# Patient Record
Sex: Male | Born: 1990 | Race: White | Hispanic: No | Marital: Married | Smoking: Never smoker
Health system: Southern US, Community
[De-identification: ages and names within clinical notes are randomized; demographics above are authoritative.]

---
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
## 2017-08-20 ENCOUNTER — Encounter: Payer: Self-pay | Admitting: Emergency Medicine

## 2017-08-20 ENCOUNTER — Other Ambulatory Visit: Payer: Self-pay

## 2017-08-20 ENCOUNTER — Emergency Department: Payer: BLUE CROSS/BLUE SHIELD

## 2017-08-20 DIAGNOSIS — R079 Chest pain, unspecified: Secondary | ICD-10-CM | POA: Diagnosis present

## 2017-08-20 LAB — CBC
HCT: 41.9 % (ref 40.0–52.0)
Hemoglobin: 14.5 g/dL (ref 13.0–18.0)
MCH: 31 pg (ref 26.0–34.0)
MCHC: 34.7 g/dL (ref 32.0–36.0)
MCV: 89.5 fL (ref 80.0–100.0)
Platelets: 288 10*3/uL (ref 150–440)
RBC: 4.68 MIL/uL (ref 4.40–5.90)
RDW: 12.6 % (ref 11.5–14.5)
WBC: 10.5 10*3/uL (ref 3.8–10.6)

## 2017-08-20 LAB — TROPONIN I

## 2017-08-20 LAB — BASIC METABOLIC PANEL
ANION GAP: 11 (ref 5–15)
BUN: 14 mg/dL (ref 6–20)
CALCIUM: 9.2 mg/dL (ref 8.9–10.3)
CO2: 27 mmol/L (ref 22–32)
CREATININE: 1.33 mg/dL — AB (ref 0.61–1.24)
Chloride: 101 mmol/L (ref 101–111)
GFR calc Af Amer: >60 mL/min (ref >60)
GLUCOSE: 99 mg/dL (ref 65–99)
Potassium: 3.9 mmol/L (ref 3.5–5.1)
Sodium: 139 mmol/L (ref 135–145)

## 2017-08-20 NOTE — ED Triage Notes (Signed)
Pt arrives ambulatory to triage with c/o left sided chest pain all day. Pt denies any other cardiac symptoms at this time. Pt is in NAD.

## 2017-08-21 ENCOUNTER — Emergency Department
Admission: EM | Admit: 2017-08-21 | Discharge: 2017-08-21 | Disposition: A | Discharge status: Home / Self Care | Payer: BLUE CROSS/BLUE SHIELD | Attending: Emergency Medicine | Admitting: Emergency Medicine

## 2017-08-21 DIAGNOSIS — R079 Chest pain, unspecified: Secondary | ICD-10-CM

## 2017-08-21 LAB — TROPONIN I

## 2017-08-21 MED ORDER — ASPIRIN 81 MG PO CHEW
324.0000 mg | CHEWABLE_TABLET | Freq: Once | ORAL | Status: AC
  Administered 2017-08-21: 324 mg via ORAL
  Filled 2017-08-21: qty 4

## 2017-08-21 MED ORDER — GI COCKTAIL ~~LOC~~
30.0000 mL | Freq: Once | ORAL | Status: AC
  Administered 2017-08-21: 30 mL via ORAL
  Filled 2017-08-21: qty 30

## 2017-08-21 MED ORDER — FAMOTIDINE 40 MG PO TABS: 40.0000 mg | ORAL_TABLET | Freq: Every evening | ORAL | 0 refills | Status: AC

## 2017-08-21 NOTE — ED Provider Notes (Signed)
[facility] Emergency Department Provider Note   ____________________________________________   First MD Initiated Contact with Patient 08/21/17 [phone]     (approximate)  I have reviewed the triage vital signs and the nursing notes.   HISTORY  Chief Complaint Chest Pain    HPI Chad Figueroa is a 27 y.o. male who comes into the hospital today with some mid chest pain that started this morning.  The patient reports that it feels like some pressure but he denies any radiation.  He also denies nausea vomiting dizziness or lightheadedness.  The patient states that sometimes it feels as though is hard to breathe but he rates his pain a 5-6 out of 10 in intensity.  The patient states that he took some Advil for the pain and it helped a little bit but it did not go away completely.  The patient has had some occasional coughing but nothing severe.  He denies any heavy lifting or spicy foods.  He is here today for evaluation of his pain.  History reviewed. No pertinent past medical history.  There are no active problems to display for this patient.   History reviewed. No pertinent surgical history.  Prior to Admission medications   Medication Sig Start Date End Date Taking? Authorizing Provider  famotidine (PEPCID) 40 MG tablet Take 1 tablet (40 mg total) by mouth every evening. 08/21/17 08/21/18  Rebecka Apley, MD    Allergies Penicillins  No family history on file.  Social History Social History   Tobacco Use  . Smoking status: Never Smoker  . Smokeless tobacco: Never Used  Substance Use Topics  . Alcohol use: Yes    Frequency: Never    Comment: rarely  . Drug use: No    Review of Systems  Constitutional: No fever/chills Eyes: No visual changes. ENT: No sore throat. Cardiovascular:  chest pain. Respiratory:  shortness of breath. Gastrointestinal: No abdominal pain.  No nausea, no vomiting.  No diarrhea.  No  constipation. Genitourinary: Negative for dysuria. Musculoskeletal: Negative for back pain. Skin: Negative for rash. Neurological: Negative for headaches, focal weakness or numbness.   ____________________________________________   PHYSICAL EXAM:  VITAL SIGNS: ED Triage Vitals  Enc Vitals Group     BP 08/20/17 2223 124/67     Pulse Rate 08/20/17 2223 (!) 110     Resp 08/20/17 2223 20     Temp 08/20/17 2223 98.5 F (36.9 C)     Temp Source 08/20/17 2223 Oral     SpO2 08/20/17 2223 98 %     Weight 08/20/17 2220 221 lb (100.2 kg)     Height 08/20/17 2220 5\' 9"  (1.753 m)     Head Circumference --      Peak Flow --      Pain Score 08/20/17 2218 6     Pain Loc --      Pain Edu? --      Excl. in GC? --     Constitutional: Alert and oriented. Well appearing and in mild distress. Eyes: Conjunctivae are normal. PERRL. EOMI. Head: Atraumatic. Nose: No congestion/rhinnorhea. Mouth/Throat: Mucous membranes are moist.  Oropharynx non-erythematous. Cardiovascular: Normal rate, regular rhythm. Grossly normal heart sounds.  Good peripheral circulation. Respiratory: Normal respiratory effort.  No retractions. Lungs CTAB. Gastrointestinal: Soft and nontender. No distention.  Positive bowel sounds Musculoskeletal: No lower extremity tenderness nor edema.   Neurologic:  Normal speech and language.  Skin:  Skin is warm, dry and intact.  Psychiatric:  Mood and affect are normal.   ____________________________________________   LABS (all labs ordered are listed, but only abnormal results are displayed)  Labs Reviewed  BASIC METABOLIC PANEL - Abnormal; Notable for the following components:      Result Value   Creatinine, Ser 1.33 (*)    All other components within normal limits  CBC  TROPONIN I  TROPONIN I   ____________________________________________  EKG  ED ECG REPORT I, Rebecka ApleyWebster,  Isa Hitz P, the attending physician, personally viewed and interpreted this ECG.   Date:  08/20/2017  EKG Time: 2223  Rate: 111  Rhythm: sinus tachycardia  Axis: normal  Intervals:none  ST&T Change: T waves in lead Figueroa and some T wave flattening in lead aVF  ____________________________________________  RADIOLOGY  Dg Chest 2 View  Result Date: 08/20/2017 CLINICAL DATA:  27 year old male with chest pain. EXAM: CHEST  2 VIEW COMPARISON:  None. FINDINGS: The heart size and mediastinal contours are within normal limits. Both lungs are clear. The visualized skeletal structures are unremarkable. IMPRESSION: No active cardiopulmonary disease. Electronically Signed   By: Elgie CollardArash  Radparvar M.D.   On: 08/20/2017 22:53    ____________________________________________   PROCEDURES  Procedure(s) performed: None  Procedures  Critical Care performed: No  ____________________________________________   INITIAL IMPRESSION / ASSESSMENT AND PLAN / ED COURSE  As part of my medical decision making, I reviewed the following data within the electronic MEDICAL RECORD NUMBER Notes from prior ED visits and [place] Controlled Substance Database   This is a 27 year old male who comes into the hospital today with some chest pain.  It is been going on all day.  My differential diagnosis includes acute coronary syndrome, reflux, pulmonary pathology   The patient had some blood work drawn and it was unremarkable.  I also drew 2 troponins which were negative.  Patient had a chest x-ray which was negative.  I gave the patient a GI cocktail as well as some aspirin for his pain and his pain improved to a 1 out of 10.  I feel that the patient may have some reflux is causing some of his pain.  I still feel that he needs further evaluation by his primary care physician of his heart.  The patient will be discharged home to follow-up with his primary care physician.       ____________________________________________   FINAL CLINICAL IMPRESSION(S) / ED DIAGNOSES  Final diagnoses:  Chest pain, unspecified  type     ED Discharge Orders        Ordered    famotidine (PEPCID) 40 MG tablet  Every evening     08/21/17 0309       Note:  This document was prepared using Dragon voice recognition software and may include unintentional dictation errors.    Rebecka ApleyWebster, Trygg Mantz P, MD 08/21/17 [phone]

## 2017-08-21 NOTE — ED Notes (Signed)
E signature pad in room is not working, pt verbalizes understanding of discharge instructions.

## 2017-08-21 NOTE — ED Notes (Signed)
Explanation of repeat troponin and turn around time provided to pt and family. Pt denies needs.

## 2017-08-21 NOTE — ED Notes (Signed)
Pt states left sided to central chest pain that was present when he awoke this am. Pt states pain is worse with deep inspiration. Pt states nothing makes pain better. Pt states has had an intermittent cough since awaking this am. Pt denies other symptoms.

## 2017-08-21 NOTE — Discharge Instructions (Signed)
Please follow up with your primary care physician.

## 2019-02-22 IMAGING — CR DG CHEST 2V
1 series · 2 of 2 positions shown · non-contrast
Comparison: None.

CLINICAL DATA: 26-year-old male with chest pain.

EXAM:
CHEST  2 VIEW

[Series 1: dg chest 2 view · 0.14mm/px · 2 of 2 slices shown]
[im 1/2]
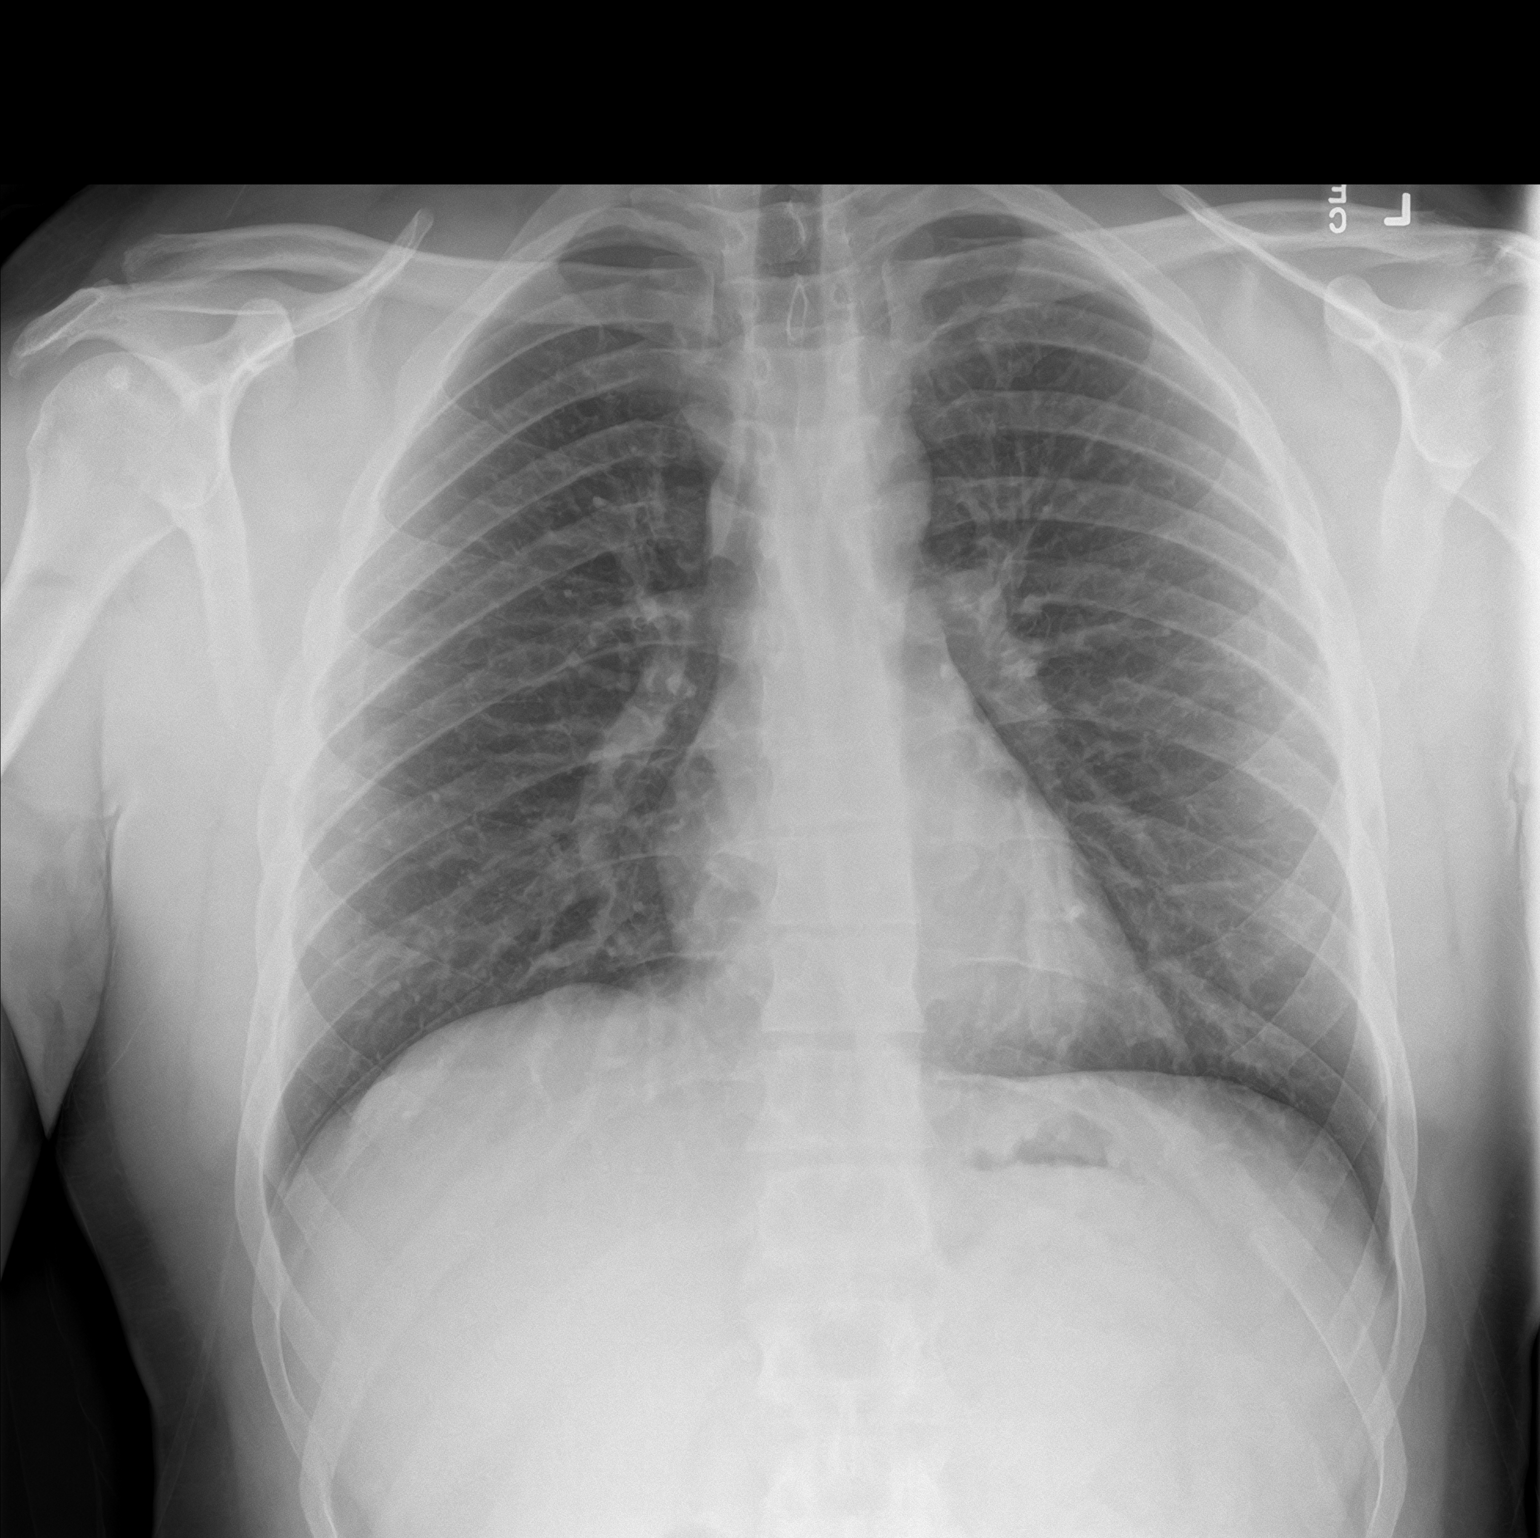
[im 2/2]
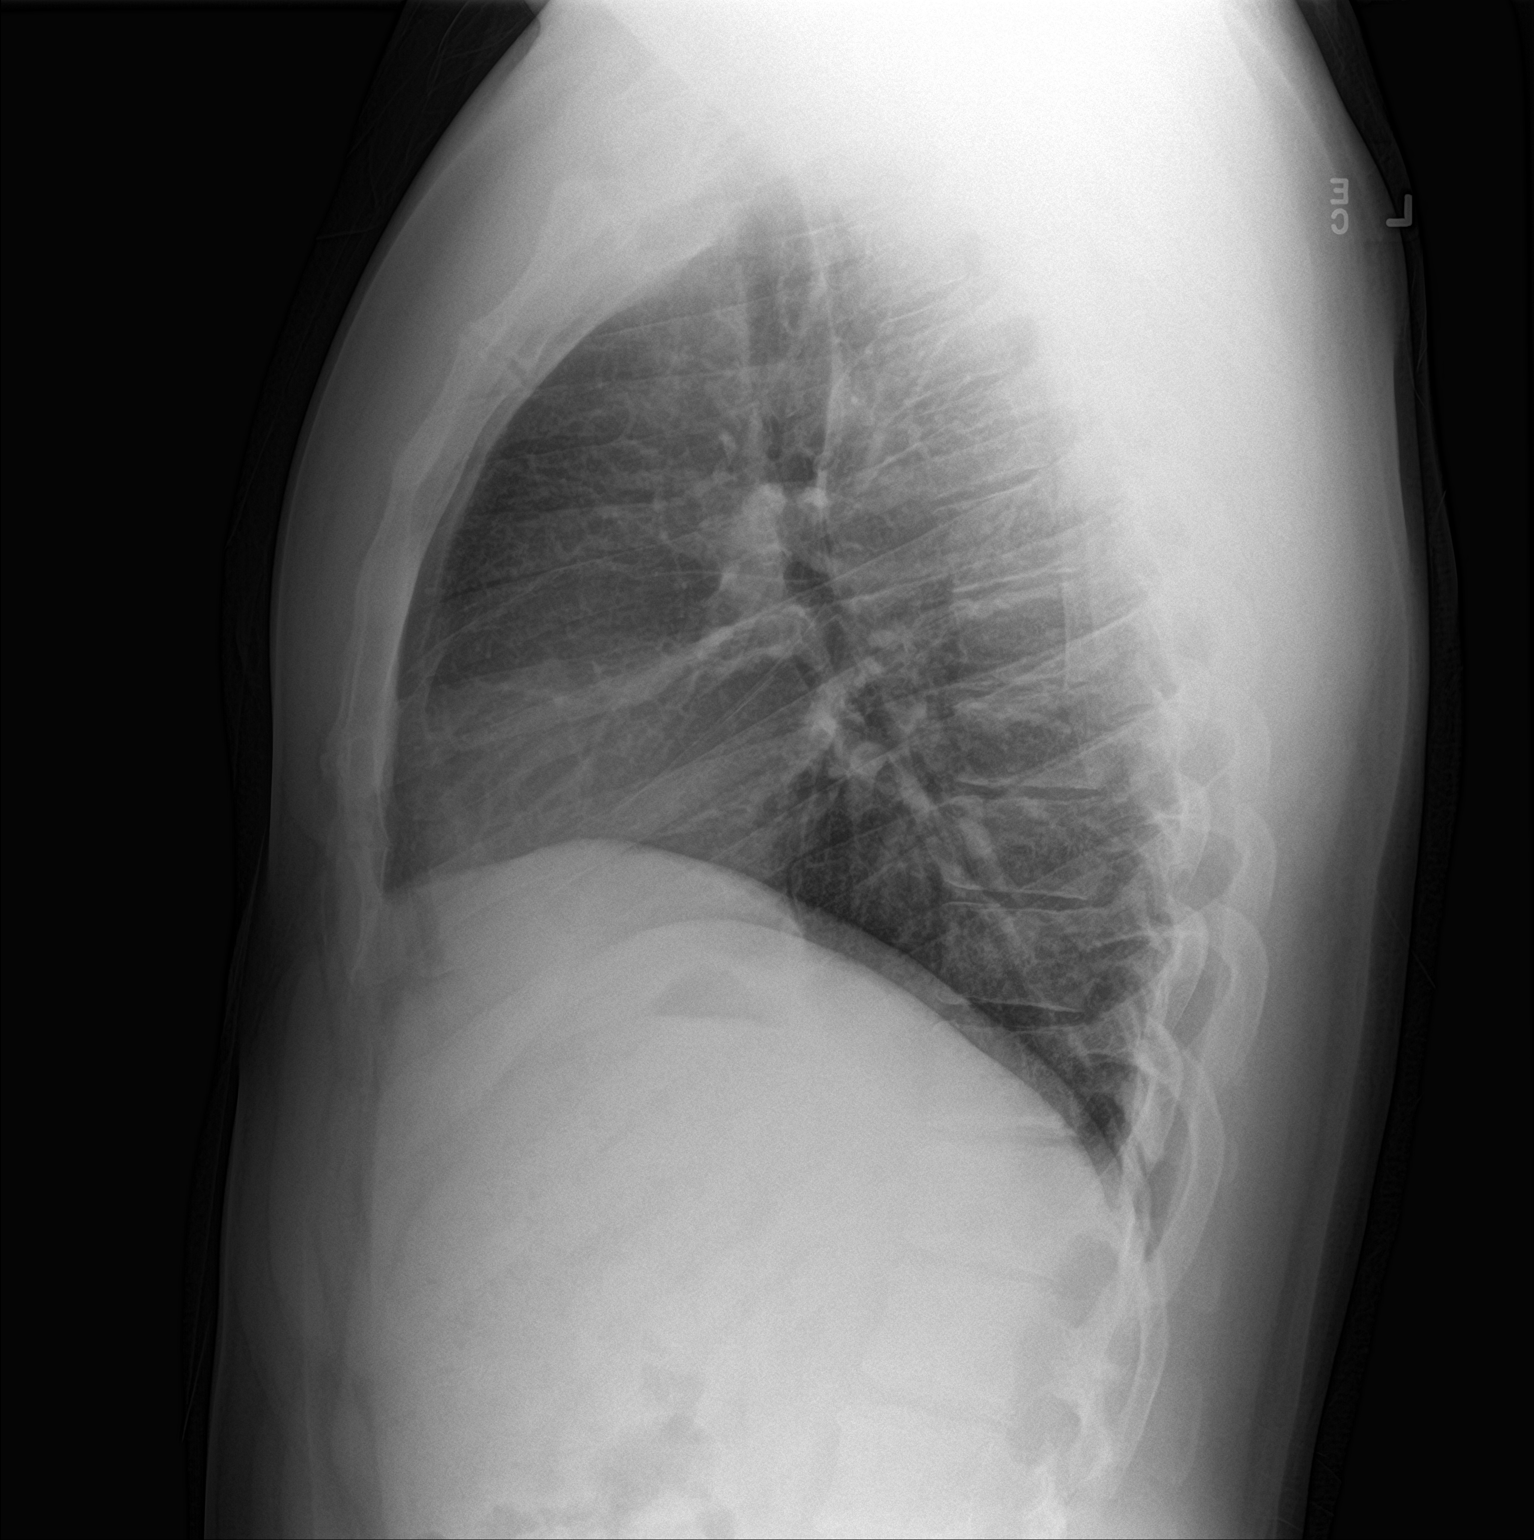

[2 of 2 positions shown; findings below may reference images not displayed]

FINDINGS: The heart size and mediastinal contours are within normal limits.
Both lungs are clear. The visualized skeletal structures are
unremarkable.
IMPRESSION: No active cardiopulmonary disease.

## 2021-06-30 ENCOUNTER — Other Ambulatory Visit: Payer: Self-pay

## 2021-06-30 DIAGNOSIS — Z0000 Encounter for general adult medical examination without abnormal findings: Secondary | ICD-10-CM

## 2021-06-30 LAB — POCT URINALYSIS DIPSTICK
Appearance: NORMAL
Bilirubin, UA: NEGATIVE
Blood, UA: NEGATIVE
Glucose, UA: NEGATIVE
Ketones, UA: NEGATIVE
Leukocytes, UA: NEGATIVE
Nitrite, UA: NEGATIVE
Protein, UA: NEGATIVE
Spec Grav, UA: 1.02 (ref 1.010–1.025)
Urobilinogen, UA: NEGATIVE E.U./dL — AB
pH, UA: 6 (ref 5.0–8.0)

## 2021-07-07 DIAGNOSIS — N529 Male erectile dysfunction, unspecified: Secondary | ICD-10-CM | POA: Insufficient documentation

## 2021-07-08 ENCOUNTER — Ambulatory Visit: Payer: Self-pay

## 2021-07-08 ENCOUNTER — Ambulatory Visit: Payer: Self-pay | Admitting: Physician Assistant

## 2021-07-08 ENCOUNTER — Other Ambulatory Visit: Payer: Self-pay

## 2021-07-08 ENCOUNTER — Encounter: Payer: Self-pay | Admitting: Physician Assistant

## 2021-07-08 VITALS — BP 111/78 | HR 74 | Temp 98.2°F | Resp 12 | Ht 69.0 in | Wt 184.4 lb

## 2021-07-08 DIAGNOSIS — Z021 Encounter for pre-employment examination: Secondary | ICD-10-CM

## 2021-07-08 DIAGNOSIS — Z23 Encounter for immunization: Secondary | ICD-10-CM

## 2021-07-08 DIAGNOSIS — J309 Allergic rhinitis, unspecified: Secondary | ICD-10-CM | POA: Insufficient documentation

## 2021-07-08 NOTE — Progress Notes (Signed)
   Subjective: Preemployment physical exam    Patient ID: Chad Figueroa, male    DOB: Apr 01, 1991, 30 y.o.   MRN: [id number]  HPI Patient presents for preemployment exam voices no concerns or complaints.   Review of Systems No acute findings on review of system.    Objective:   Physical Exam  No acute distress.  Temperature 98.2, pulse 74, respiration 12, BP is 111/78, and patient 90% O2 sat on room air.  Patient weighs 184 pounds and BMI is 27.23. HEENT is unremarkable.  Neck supple without lymphadenopathy or bruits.  Lungs clear to auscultation.  Heart regular rate and rhythm. Abdomen is negative HSM, normoactive bowel sounds, soft, nontender to palpation. No obvious deformity to the upper or lower extremities.  Patient has full and equal range of motion of the upper and lower extremities. No obvious deformity to cervical lumbar spine.  Patient has full and equal range of motion of the cervical lumbar spine. Cranial nerves II through XII are grossly intact.  DTRs are 2+ without clonus.      Assessment & Plan: Well exam   Patient cleared for BLET

## 2021-07-08 NOTE — Addendum Note (Signed)
Addended by: Gardner Candle on: 07/08/2021 01:16 PM   Modules accepted: Orders

## 2021-07-11 LAB — CMP12+LP+TP+TSH+6AC+CBC/D/PLT
ALT: 31 IU/L (ref 0–44)
AST: 26 IU/L (ref 0–40)
Albumin/Globulin Ratio: 2 (ref 1.2–2.2)
Albumin: 4.9 g/dL (ref 4.0–5.0)
Alkaline Phosphatase: 84 IU/L (ref 44–121)
BUN/Creatinine Ratio: 14 (ref 9–20)
BUN: 11 mg/dL (ref 6–20)
Basophils Absolute: 0 10*3/uL (ref 0.0–0.2)
Basos: 1 %
Bilirubin Total: 0.4 mg/dL (ref 0.0–1.2)
Calcium: 9.8 mg/dL (ref 8.7–10.2)
Chloride: 102 mmol/L (ref 96–106)
Chol/HDL Ratio: 4 ratio (ref 0.0–5.0)
Cholesterol, Total: 198 mg/dL (ref 100–199)
Creatinine, Ser: 0.8 mg/dL (ref 0.76–1.27)
EOS (ABSOLUTE): 0.1 10*3/uL (ref 0.0–0.4)
Eos: 1 %
Estimated CHD Risk: 0.8 times avg. (ref 0.0–1.0)
Free Thyroxine Index: 2.6 (ref 1.2–4.9)
GGT: 54 IU/L (ref 0–65)
Globulin, Total: 2.4 g/dL (ref 1.5–4.5)
Glucose: 85 mg/dL (ref 70–99)
HDL: 49 mg/dL (ref >39)
Hematocrit: 43.8 % (ref 37.5–51.0)
Hemoglobin: 15.5 g/dL (ref 13.0–17.7)
Immature Grans (Abs): 0 10*3/uL (ref 0.0–0.1)
Immature Granulocytes: 0 %
Iron: 156 ug/dL (ref 38–169)
LDH: 153 IU/L (ref 121–224)
LDL Chol Calc (NIH): 134 mg/dL — ABNORMAL HIGH (ref 0–99)
Lymphocytes Absolute: 2.2 10*3/uL (ref 0.7–3.1)
Lymphs: 31 %
MCH: 30.9 pg (ref 26.6–33.0)
MCHC: 35.4 g/dL (ref 31.5–35.7)
MCV: 87 fL (ref 79–97)
Monocytes Absolute: 0.6 10*3/uL (ref 0.1–0.9)
Monocytes: 8 %
Neutrophils Absolute: 4.2 10*3/uL (ref 1.4–7.0)
Neutrophils: 59 %
Phosphorus: 3.8 mg/dL (ref 2.8–4.1)
Platelets: 337 10*3/uL (ref 150–450)
Potassium: 4.8 mmol/L (ref 3.5–5.2)
RBC: 5.01 x10E6/uL (ref 4.14–5.80)
RDW: 11.6 % (ref 11.6–15.4)
Sodium: 141 mmol/L (ref 134–144)
T3 Uptake Ratio: 33 % (ref 24–39)
T4, Total: 7.8 ug/dL (ref 4.5–12.0)
TSH: 1.97 u[IU]/mL (ref 0.450–4.500)
Total Protein: 7.3 g/dL (ref 6.0–8.5)
Triglycerides: 82 mg/dL (ref 0–149)
Uric Acid: 6 mg/dL (ref 3.8–8.4)
VLDL Cholesterol Cal: 15 mg/dL (ref 5–40)
WBC: 7.2 10*3/uL (ref 3.4–10.8)
eGFR: 121 mL/min/{1.73_m2} (ref >59)

## 2021-07-11 LAB — QUANTIFERON-TB GOLD PLUS
QuantiFERON Mitogen Value: >10 IU/mL
QuantiFERON Nil Value: 0.04 IU/mL
QuantiFERON TB1 Ag Value: 0.11 IU/mL
QuantiFERON TB2 Ag Value: 0.07 IU/mL
QuantiFERON-TB Gold Plus: NEGATIVE

## 2021-07-11 LAB — HEPATITIS B SURFACE ANTIBODY,QUALITATIVE: Hep B Surface Ab, Qual: REACTIVE

## 2022-01-01 ENCOUNTER — Other Ambulatory Visit: Payer: Self-pay

## 2022-01-01 DIAGNOSIS — Z021 Encounter for pre-employment examination: Secondary | ICD-10-CM

## 2022-01-01 NOTE — Progress Notes (Signed)
[facility] Acct #:  [id number] [facility] Specimen #:  [id number]  Rapid drug screen results = Negative  AMD
